# Patient Record
Sex: Female | Born: 1990 | Race: White | Hispanic: No | Marital: Single | Smoking: Never smoker
Health system: Southern US, Community
[De-identification: ages and names within clinical notes are randomized; demographics above are authoritative.]

---

## 2015-03-01 ENCOUNTER — Encounter (HOSPITAL_COMMUNITY): Payer: Self-pay | Admitting: *Deleted

## 2015-03-01 ENCOUNTER — Other Ambulatory Visit (HOSPITAL_COMMUNITY)
Admission: RE | Admit: 2015-03-01 | Discharge: 2015-03-01 | Disposition: A | Discharge status: Home / Self Care | Payer: Medicaid Other | Source: Ambulatory Visit | Attending: Emergency Medicine | Admitting: Emergency Medicine

## 2015-03-01 ENCOUNTER — Emergency Department (INDEPENDENT_AMBULATORY_CARE_PROVIDER_SITE_OTHER)
Admission: EM | Admit: 2015-03-01 | Discharge: 2015-03-01 | Disposition: A | Discharge status: Home / Self Care | Payer: PRIVATE HEALTH INSURANCE | Source: Home / Self Care | Attending: Emergency Medicine | Admitting: Emergency Medicine

## 2015-03-01 DIAGNOSIS — A499 Bacterial infection, unspecified: Secondary | ICD-10-CM

## 2015-03-01 DIAGNOSIS — Z113 Encounter for screening for infections with a predominantly sexual mode of transmission: Secondary | ICD-10-CM | POA: Insufficient documentation

## 2015-03-01 DIAGNOSIS — N760 Acute vaginitis: Secondary | ICD-10-CM | POA: Diagnosis present

## 2015-03-01 DIAGNOSIS — B9689 Other specified bacterial agents as the cause of diseases classified elsewhere: Secondary | ICD-10-CM

## 2015-03-01 LAB — POCT PREGNANCY, URINE: Preg Test, Ur: NEGATIVE

## 2015-03-01 LAB — POCT URINALYSIS DIP (DEVICE)
Bilirubin Urine: NEGATIVE
GLUCOSE, UA: NEGATIVE mg/dL
Hgb urine dipstick: NEGATIVE
Ketones, ur: NEGATIVE mg/dL
Leukocytes, UA: NEGATIVE
Nitrite: NEGATIVE
PH: 6 (ref 5.0–8.0)
PROTEIN: 30 mg/dL — AB
UROBILINOGEN UA: 0.2 mg/dL (ref 0.0–1.0)

## 2015-03-01 MED ORDER — LIDOCAINE HCL (PF) 1 % IJ SOLN
INTRAMUSCULAR | Status: AC
  Filled 2015-03-01: qty 5

## 2015-03-01 MED ORDER — AZITHROMYCIN 250 MG PO TABS
1000.0000 mg | ORAL_TABLET | Freq: Once | ORAL | Status: AC
  Administered 2015-03-01: 1000 mg via ORAL

## 2015-03-01 MED ORDER — CEFTRIAXONE SODIUM 250 MG IJ SOLR
250.0000 mg | Freq: Once | INTRAMUSCULAR | Status: AC
  Administered 2015-03-01: 250 mg via INTRAMUSCULAR

## 2015-03-01 MED ORDER — AZITHROMYCIN 250 MG PO TABS
ORAL_TABLET | ORAL | Status: AC
  Filled 2015-03-01: qty 4

## 2015-03-01 MED ORDER — METRONIDAZOLE 500 MG PO TABS: 500.0000 mg | ORAL_TABLET | Freq: Two times a day (BID) | ORAL | Status: AC

## 2015-03-01 MED ORDER — CEFTRIAXONE SODIUM 250 MG IJ SOLR
INTRAMUSCULAR | Status: AC
  Filled 2015-03-01: qty 250

## 2015-03-01 NOTE — ED Provider Notes (Addendum)
HPI  SUBJECTIVE:  Angel Pierce is a 24 y.o. female who presents with  3 days oderous thick, white vaginal discharge, Had vaginal  itching at first, but this resolved after using Monistat 3 day. Reports persistent vaginal discharge and odor. No urgency, frequency, dysuria, oderous urine, hematuria,  genital blisters. No other aggravating, alleviating factors.  No fevers, N/V, abd pain, back pain. No recent abx use. Pt sexually active with new  female  partner who is asxatic. States they generally use condoms, but didn't once. STD's  a concern today. Has h/o BV, yeast. No h/o gonorrhea chlamydia, Trichomonas, No h/o syphilis, herpes, HIV. No h/o DM. Patient is on Depo-Provera for contraception.    History reviewed. No pertinent past medical history.  History reviewed. No pertinent past surgical history.  History reviewed. No pertinent family history.  Social History  Substance Use Topics  . Smoking status: Never Smoker   . Smokeless tobacco: None  . Alcohol Use: Yes    No current facility-administered medications for this encounter.  Current outpatient prescriptions:  Marland Kitchen  MedroxyPROGESTERone Acetate (DEPO-PROVERA IM), Inject into the muscle., Disp: , Rfl:  .  metroNIDAZOLE (FLAGYL) 500 MG tablet, Take 1 tablet (500 mg total) by mouth 2 (two) times daily. X 7 days, Disp: 14 tablet, Rfl: 0  Allergies no known allergies   ROS  As noted in HPI.   Physical Exam  BP 128/82 mmHg  Pulse 78  Temp(Src) 98.6 F (37 C) (Oral)  Resp 16  SpO2 100%  Constitutional: Well developed, well nourished, no acute distress Eyes:  EOMI, conjunctiva normal bilaterally HENT: Normocephalic, atraumatic,mucus membranes moist Respiratory: Normal inspiratory effort Cardiovascular: Normal rate GI: nondistended soft, nontender. No suprapubic tenderness  back: No CVA tenderness GU: External genitalia normal.  Normal vaginal mucosa.  Normal os. Thin  oderous white vaginal discharge.  Uterus smooth,  NT. No   CMT. No  adnexal tenderness. No adnexal masses.  Chaperone present during exam skin: No rash, skin intact Musculoskeletal: no deformities Neurologic: Alert & oriented x 3, no focal neuro deficits Psychiatric: Speech and behavior appropriate   ED Course   Medications  azithromycin (ZITHROMAX) tablet 1,000 mg (1,000 mg Oral Given 03/01/15 1452)  cefTRIAXone (ROCEPHIN) injection 250 mg (250 mg Intramuscular Given 03/01/15 1452)    Orders Placed This Encounter  Procedures  . POCT urinalysis dip (device)    Standing Status: Standing     Number of Occurrences: 1     Standing Expiration Date:   . Pregnancy, urine POC    Standing Status: Standing     Number of Occurrences: 1     Standing Expiration Date:     No results found for this or any previous visit (from the past 24 hour(s)). No results found.  ED Clinical Impression  Vaginitis  BV (bacterial vaginosis)   ED Assessment/Plan U pregnant negative, no UTI. proteinuria is most likely from vaginitis.  H&P most c/w  BV.  Sent off GC/chlamydia, wet prep. Will treat empirically gonorrhea and chlamydia now per patient request. Giving ceftriaxone 250 mg IM, azithro 1 gm po, . Will send home with flagyl. Advised pt to refrain from sexual contact until she knows lab results, symptoms resolve, and partner(s) are treated if necessary. Pt provided working phone number. Pt agrees with plan.   Discussed MDM, plan and followup with patient. Discussed sn/sx that should prompt return to the UC or ED. Patient  agrees with plan.  *This clinic note was created using Dragon dictation software.  Therefore, there may be occasional mistakes despite careful proofreading.  ? Results for orders placed or performed during the hospital encounter of 03/01/15  POCT urinalysis dip (device)  Result Value Ref Range   Glucose, UA NEGATIVE NEGATIVE mg/dL   Bilirubin Urine NEGATIVE NEGATIVE   Ketones, ur NEGATIVE NEGATIVE mg/dL   Specific Gravity, Urine  >=1.030 1.005 - 1.030   Hgb urine dipstick NEGATIVE NEGATIVE   pH 6.0 5.0 - 8.0   Protein, ur 30 (A) NEGATIVE mg/dL   Urobilinogen, UA 0.2 0.0 - 1.0 mg/dL   Nitrite NEGATIVE NEGATIVE   Leukocytes, UA NEGATIVE NEGATIVE  Pregnancy, urine POC  Result Value Ref Range   Preg Test, Ur NEGATIVE NEGATIVE  Cervicovaginal ancillary only  Result Value Ref Range   Chlamydia **POSITIVE** (A)    Neisseria gonorrhea Negative   Cervicovaginal ancillary only  Result Value Ref Range   Wet Prep (BD Affirm) **POSITIVE for Gardnerella** (A)    8/31- gonorrhea/ chlamydia labs reviewed. Positive chlamydia. Negative gonorrhea. Wet prep still pending. Patient was treated for both gonorrhea and Chlamydia empirically in the clinic. RN to call patient, inform her of results and advise that partner be treated.  9/1- wet prep reviewed. Positive for BV. Patient was appropriately treated with Flagyl.  Domenick Gong, MD 03/01/15 1458  Domenick Gong, MD 03/02/15 1446  Domenick Gong, MD 03/03/15 [phone]

## 2015-03-01 NOTE — Discharge Instructions (Signed)
Take the medication as written. Give us a working phone number so that we can contact you if needed. Refrain from sexual contact until you know your results and your partner(s) are treated if necessary. Return if you get worse, have a fever >100.4, or for any concerns.  ° °Go to www.goodrx.com to look up your medications. This will give you a list of where you can find your prescriptions at the most affordable prices.   °

## 2015-03-01 NOTE — ED Notes (Signed)
Pt  Reports  Symptoms  Of  Vaginal  Irritation  /  Discharge  For  Several  Days    Pt  denys   Any  Low  Abdominal  Pain          Ambulated  With a  Slow  Steady  Fluid  Gait          Symptoms  Not  releived  By OTC  meds

## 2015-03-02 LAB — CERVICOVAGINAL ANCILLARY ONLY
CHLAMYDIA, DNA PROBE: POSITIVE — AB
NEISSERIA GONORRHEA: NEGATIVE

## 2015-03-03 LAB — CERVICOVAGINAL ANCILLARY ONLY: Wet Prep (BD Affirm): POSITIVE — AB

## 2015-03-04 NOTE — ED Notes (Signed)
Attempted  To  Call  Pt     Regarding  Lab  Results      -  Answering  Machine  Picked  Up       Message  Left  To  Return  Phone  Call

## 2015-03-04 NOTE — ED Notes (Signed)
Pt  Called   Back  Pt  Advised   Of  Pos   Tests   Results  After   Id  Verified     Pt  Reports  Symptoms     Better       Pt  Advised  To  Return if  Symptoms   Reoccur    Advised   To   Notify  Partner  Complete  The  Plan of  Care   Use  Protection

## 2015-03-15 NOTE — ED Notes (Signed)
[facility] form [phone] completed and faxed to [facility] for their records

## 2015-09-29 ENCOUNTER — Encounter: Payer: PRIVATE HEALTH INSURANCE | Admitting: Obstetrics and Gynecology

## 2016-08-11 ENCOUNTER — Encounter (HOSPITAL_COMMUNITY): Payer: Self-pay | Admitting: Emergency Medicine

## 2016-08-11 ENCOUNTER — Emergency Department (HOSPITAL_COMMUNITY): Payer: PRIVATE HEALTH INSURANCE

## 2016-08-11 ENCOUNTER — Emergency Department (HOSPITAL_COMMUNITY)
Admission: EM | Admit: 2016-08-11 | Discharge: 2016-08-11 | Disposition: A | Discharge status: Home / Self Care | Payer: PRIVATE HEALTH INSURANCE | Attending: Emergency Medicine | Admitting: Emergency Medicine

## 2016-08-11 DIAGNOSIS — R0981 Nasal congestion: Secondary | ICD-10-CM | POA: Insufficient documentation

## 2016-08-11 DIAGNOSIS — R6889 Other general symptoms and signs: Secondary | ICD-10-CM

## 2016-08-11 DIAGNOSIS — R05 Cough: Secondary | ICD-10-CM | POA: Insufficient documentation

## 2016-08-11 MED ORDER — OSELTAMIVIR PHOSPHATE 75 MG PO CAPS: 75.0000 mg | ORAL_CAPSULE | Freq: Two times a day (BID) | ORAL | 0 refills | Status: AC

## 2016-08-11 NOTE — ED Provider Notes (Signed)
[facility]-EMERGENCY DEPT Provider Note   CSN: [id number] Arrival date & time: 08/11/16  1820  .By signing my name below, I, Linna DarnerRussell Turner, attest that this documentation has been prepared under the direction and in the presence of Arthor CaptainAbigail Ailyn Gladd, PA-C. Electronically Signed: Linna Darnerussell Turner, Scribe. 08/11/2016. 7:25 PM.  History   Chief Complaint Chief Complaint  Patient presents with  . flu symptoms    The history is provided by the patient. No language interpreter was used.     HPI Comments: Angel Pierce is a 26 y.o. female who presents to the Emergency Department complaining of flu-like symptoms beginning two days ago. She reports nasal congestion, cough, generalized body aches, fever (tmax 102), and chills. She also notes some chest pain secondary to her cough. Pt has been taking Dayquil and hydrating well with some improvement of her symptoms. No h/o asthma. She did not receive a flu vaccination this season. She denies nausea, vomiting, or any other associated symptoms.  History reviewed. No pertinent past medical history.  There are no active problems to display for this patient.   History reviewed. No pertinent surgical history.  OB History    No data available       Home Medications    Prior to Admission medications   Medication Sig Start Date End Date Taking? Authorizing Provider  MedroxyPROGESTERone Acetate (DEPO-PROVERA IM) Inject into the muscle.    Historical Provider, MD  metroNIDAZOLE (FLAGYL) 500 MG tablet Take 1 tablet (500 mg total) by mouth 2 (two) times daily. X 7 days 03/01/15   Domenick GongAshley Mortenson, MD    Family History No family history on file.  Social History Social History  Substance Use Topics  . Smoking status: Never Smoker  . Smokeless tobacco: Not on file  . Alcohol use Yes     Allergies   Patient has no known allergies.   Review of Systems Review of Systems  Constitutional: Positive for chills and fever.  HENT: Positive for congestion.    Respiratory: Positive for cough.   Cardiovascular: Positive for chest pain (secondary to cough).  Gastrointestinal: Negative for nausea and vomiting.  Musculoskeletal: Positive for myalgias.  All other systems reviewed and are negative.    Physical Exam Updated Vital Signs BP 125/75 (BP Location: Left Arm)   Pulse 80   Temp 98.7 F (37.1 C) (Oral)   Resp 16   Ht 5\' 6"  (1.676 m)   Wt 140 lb (63.5 kg)   SpO2 100%   BMI 22.60 kg/m   Physical Exam  Constitutional: She is oriented to person, place, and time. She appears well-developed and well-nourished. No distress.  HENT:  Head: Normocephalic and atraumatic.  Facial congestion.  Eyes: Conjunctivae and EOM are normal.  Neck: Neck supple. No tracheal deviation present.  Cardiovascular: Normal rate.   Pulmonary/Chest: Effort normal. No respiratory distress.  Musculoskeletal: Normal range of motion.  Neurological: She is alert and oriented to person, place, and time.  Skin: Skin is warm and dry.  Psychiatric: She has a normal mood and affect. Her behavior is normal.  Nursing note and vitals reviewed.    ED Treatments / Results  Labs (all labs ordered are listed, but only abnormal results are displayed) Labs Reviewed - No data to display  EKG  EKG Interpretation None       Radiology No results found.  Procedures Procedures (including critical care time)  DIAGNOSTIC STUDIES: Oxygen Saturation is 100% on RA, normal by my interpretation.    COORDINATION OF CARE:  7:28 PM Discussed treatment plan with pt at bedside and pt agreed to plan.  Medications Ordered in ED Medications - No data to display   Initial Impression / Assessment and Plan / ED Course  I have reviewed the triage vital signs and the nursing notes.  Pertinent labs & imaging results that were available during my care of the patient were reviewed by me and considered in my medical decision making (see chart for details).     Patient with  symptoms consistent with influenza. .  No signs of dehydration, tolerating PO's.  Lungs are clear. CXR neg. Patient will be given tamiflu per cdc reccommendations.  Patient will be discharged with instructions to orally hydrate, rest, and use over-the-counter medications such as anti-inflammatories ibuprofen and Aleve for muscle aches and Tylenol for fever.  Patient will also be given a cough suppressant.   Final Clinical Impressions(s) / ED Diagnoses   Final diagnoses:  Flu-like symptoms    New Prescriptions New Prescriptions   No medications on file   I personally performed the services described in this documentation, which was scribed in my presence. The recorded information has been reviewed and is accurate.      Arthor Captain, PA-C 08/14/16 1619    Rolan Bucco, MD 08/20/16 [phone]

## 2016-08-11 NOTE — ED Triage Notes (Signed)
Pt c/o nasal congestion, body aches, fever/chills, productive cough with thin clear mucous, headache x 2 days. Some loss of appetite. Denies n/v/d. She has taken BC powders and Dayquil with little relief. She has not had a flu vaccination this year. She denies sick contacts.

## 2016-08-11 NOTE — Discharge Instructions (Signed)
Your xray is negative  You appear to have an upper respiratory infection (URI). An upper respiratory tract infection, or cold, is a viral infection of the air passages leading to the lungs. It is contagious and can be spread to others, especially during the first 3 or 4 days. It cannot be cured by antibiotics or other medicines. RETURN IMMEDIATELY IF you develop shortness of breath, confusion or altered mental status, a new rash, become dizzy, faint, or poorly responsive, or are unable to be cared for at home.

## 2017-05-12 ENCOUNTER — Emergency Department (HOSPITAL_COMMUNITY): Payer: Medicaid Other

## 2017-05-12 ENCOUNTER — Encounter (HOSPITAL_COMMUNITY): Payer: Self-pay | Admitting: Emergency Medicine

## 2017-05-12 ENCOUNTER — Emergency Department (HOSPITAL_COMMUNITY)
Admission: EM | Admit: 2017-05-12 | Discharge: 2017-05-12 | Disposition: A | Discharge status: Home / Self Care | Payer: Medicaid Other | Attending: Emergency Medicine | Admitting: Emergency Medicine

## 2017-05-12 DIAGNOSIS — Z3A12 12 weeks gestation of pregnancy: Secondary | ICD-10-CM | POA: Insufficient documentation

## 2017-05-12 DIAGNOSIS — O468X1 Other antepartum hemorrhage, first trimester: Secondary | ICD-10-CM | POA: Diagnosis not present

## 2017-05-12 DIAGNOSIS — R102 Pelvic and perineal pain: Secondary | ICD-10-CM | POA: Insufficient documentation

## 2017-05-12 DIAGNOSIS — Z79899 Other long term (current) drug therapy: Secondary | ICD-10-CM | POA: Insufficient documentation

## 2017-05-12 DIAGNOSIS — O9989 Other specified diseases and conditions complicating pregnancy, childbirth and the puerperium: Secondary | ICD-10-CM | POA: Insufficient documentation

## 2017-05-12 DIAGNOSIS — O200 Threatened abortion: Secondary | ICD-10-CM | POA: Diagnosis not present

## 2017-05-12 DIAGNOSIS — O418X10 Other specified disorders of amniotic fluid and membranes, first trimester, not applicable or unspecified: Secondary | ICD-10-CM

## 2017-05-12 DIAGNOSIS — N898 Other specified noninflammatory disorders of vagina: Secondary | ICD-10-CM | POA: Diagnosis not present

## 2017-05-12 DIAGNOSIS — N390 Urinary tract infection, site not specified: Secondary | ICD-10-CM | POA: Diagnosis not present

## 2017-05-12 DIAGNOSIS — O26891 Other specified pregnancy related conditions, first trimester: Secondary | ICD-10-CM | POA: Insufficient documentation

## 2017-05-12 DIAGNOSIS — R1030 Lower abdominal pain, unspecified: Secondary | ICD-10-CM

## 2017-05-12 LAB — URINALYSIS, ROUTINE W REFLEX MICROSCOPIC
BILIRUBIN URINE: NEGATIVE
Glucose, UA: NEGATIVE mg/dL
Hgb urine dipstick: NEGATIVE
KETONES UR: 20 mg/dL — AB
Nitrite: POSITIVE — AB
PROTEIN: NEGATIVE mg/dL
Specific Gravity, Urine: 1.016 (ref 1.005–1.030)
pH: 7 (ref 5.0–8.0)

## 2017-05-12 LAB — COMPREHENSIVE METABOLIC PANEL
ALBUMIN: 3.8 g/dL (ref 3.5–5.0)
ALK PHOS: 68 U/L (ref 38–126)
ALT: 15 U/L (ref 14–54)
ANION GAP: 10 (ref 5–15)
AST: 19 U/L (ref 15–41)
BILIRUBIN TOTAL: 0.6 mg/dL (ref 0.3–1.2)
BUN: 8 mg/dL (ref 6–20)
CALCIUM: 9.1 mg/dL (ref 8.9–10.3)
CO2: 20 mmol/L — ABNORMAL LOW (ref 22–32)
Chloride: 106 mmol/L (ref 101–111)
Creatinine, Ser: 0.67 mg/dL (ref 0.44–1.00)
GFR calc Af Amer: >60 mL/min (ref >60)
Glucose, Bld: 86 mg/dL (ref 65–99)
POTASSIUM: 3.7 mmol/L (ref 3.5–5.1)
Sodium: 136 mmol/L (ref 135–145)
TOTAL PROTEIN: 6.9 g/dL (ref 6.5–8.1)

## 2017-05-12 LAB — CBC
HCT: 39.1 % (ref 36.0–46.0)
HEMOGLOBIN: 13.9 g/dL (ref 12.0–15.0)
MCH: 31 pg (ref 26.0–34.0)
MCHC: 35.5 g/dL (ref 30.0–36.0)
MCV: 87.1 fL (ref 78.0–100.0)
PLATELETS: 293 10*3/uL (ref 150–400)
RBC: 4.49 MIL/uL (ref 3.87–5.11)
RDW: 12.1 % (ref 11.5–15.5)
WBC: 12.2 10*3/uL — ABNORMAL HIGH (ref 4.0–10.5)

## 2017-05-12 LAB — WET PREP, GENITAL
CLUE CELLS WET PREP: NONE SEEN
Sperm: NONE SEEN
Trich, Wet Prep: NONE SEEN
YEAST WET PREP: NONE SEEN

## 2017-05-12 LAB — ABO/RH: ABO/RH(D): O POS

## 2017-05-12 LAB — HCG, QUANTITATIVE, PREGNANCY: hCG, Beta Chain, Quant, S: 136054 m[IU]/mL — ABNORMAL HIGH (ref <5)

## 2017-05-12 MED ORDER — SODIUM CHLORIDE 0.9 % IV BOLUS (SEPSIS)
1000.0000 mL | Freq: Once | INTRAVENOUS | Status: AC
  Administered 2017-05-12: 1000 mL via INTRAVENOUS

## 2017-05-12 MED ORDER — PRENATAL COMPLETE 14-0.4 MG PO TABS: 1.0000 | ORAL_TABLET | Freq: Every day | ORAL | 2 refills | Status: AC

## 2017-05-12 MED ORDER — CEPHALEXIN 500 MG PO CAPS: 1000.0000 mg | ORAL_CAPSULE | Freq: Two times a day (BID) | ORAL | 0 refills | Status: AC

## 2017-05-12 NOTE — ED Provider Notes (Signed)
[facility]-EMERGENCY DEPT Provider Note   CSN: [id number] Arrival date & time: 05/12/17  1643     History   Chief Complaint Chief Complaint  Patient presents with  . Vaginal Discharge    HPI Angel Pierce is a 26 y.o. G2P1001 otherwise healthy female, currently ~6232w2d pregnant based on LMP, who presents to the ED with complaints of intermittent lower abdominal cramping, vaginal discharge, vaginal spotting, nausea, and some mild vaginal itching/irritation x1.5 weeks.  Patient states that she just recently found out that she is pregnant, LMP was 02/15/17, and she is sexually active with one female partner, unprotected.  She does not currently have any prenatal care, but previously she was under the care [place] [facility] [place] for her prior pregnancy (5369yrs ago); no prior issues with that pregnancy, no hx of ectopic or miscarriages.  She states that she has been quite a bit stressed out lately, and wonders whether this is causing her symptoms.  She describes her pain as 3/10 intermittent lower abdominal cramping, nonradiating, with no known aggravating or alleviating factors, has not tried anything for her symptoms.  She states that her vaginal discharge is a clearish creamy white color.  She also reports occasional brownish vaginal bleeding, stating that it is mostly just a scant spotting when she uses the restroom, no passage of clots. Reports typical morning nausea. Also mentions that she has some occasional mild vaginal irritation/itching.   She denies fevers, chills, CP, SOB, vomiting, diarrhea/constipation, obstipation, melena, hematochezia, hematuria, dysuria, genital sores, myalgias, arthralgias, numbness, tingling, focal weakness, or any other complaints at this time. Denies recent travel, sick contacts, suspicious food intake, EtOH use, NSAID use, or prior abd surgeries.    The history is provided by the patient and medical records. No language interpreter was  used.  Vaginal Discharge   Associated symptoms include abdominal pain and nausea. Pertinent negatives include no fever, no constipation, no diarrhea, no vomiting and no dysuria.  Abdominal Cramping  This is a new problem. The current episode started more than 1 week ago. The problem occurs daily. The problem has not changed since onset.Associated symptoms include abdominal pain. Pertinent negatives include no chest pain and no shortness of breath. Nothing aggravates the symptoms. Nothing relieves the symptoms. She has tried nothing for the symptoms. The treatment provided no relief.    History reviewed. No pertinent past medical history.  There are no active problems to display for this patient.   History reviewed. No pertinent surgical history.  OB History    No data available       Home Medications    Prior to Admission medications   Medication Sig Start Date End Date Taking? Authorizing Provider  MedroxyPROGESTERone Acetate (DEPO-PROVERA IM) Inject into the muscle.    [provider]  metroNIDAZOLE (FLAGYL) 500 MG tablet Take 1 tablet (500 mg total) by mouth 2 (two) times daily. X 7 days 03/01/15   Domenick GongMortenson, Ashley, MD  oseltamivir (TAMIFLU) 75 MG capsule Take 1 capsule (75 mg total) by mouth every 12 (twelve) hours. 08/11/16   Arthor CaptainHarris, Abigail, PA-C    Family History History reviewed. No pertinent family history.  Social History Social History   Tobacco Use  . Smoking status: Never Smoker  Substance Use Topics  . Alcohol use: Yes  . Drug use: Not on file     Allergies   Patient has no known allergies.   Review of Systems Review of Systems  Constitutional: Negative for chills and  fever.  Respiratory: Negative for shortness of breath.   Cardiovascular: Negative for chest pain.  Gastrointestinal: Positive for abdominal pain and nausea. Negative for blood in stool, constipation, diarrhea and vomiting.  Genitourinary: Positive for vaginal bleeding, vaginal  discharge and vaginal pain (slight itching/irritation). Negative for dysuria, genital sores and hematuria.  Musculoskeletal: Negative for arthralgias and myalgias.  Skin: Negative for color change.  Allergic/Immunologic: Negative for immunocompromised state.  Neurological: Negative for weakness and numbness.  Psychiatric/Behavioral: Negative for confusion.   All other systems reviewed and are negative for acute change except as noted in the HPI.    Physical Exam Updated Vital Signs BP 125/76 (BP Location: Left Arm)   Pulse 76   Temp 98.2 F (36.8 C) (Oral)   Resp 18   SpO2 100%   Physical Exam  Constitutional: She is oriented to person, place, and time. Vital signs are normal. She appears well-developed and well-nourished.  Non-toxic appearance. No distress.  Afebrile, nontoxic, NAD although appears slightly anxious  HENT:  Head: Normocephalic and atraumatic.  Mouth/Throat: Oropharynx is clear and moist and mucous membranes are normal.  Eyes: Conjunctivae and EOM are normal. Right eye exhibits no discharge. Left eye exhibits no discharge.  Neck: Normal range of motion. Neck supple.  Cardiovascular: Normal rate, regular rhythm, normal heart sounds and intact distal pulses. Exam reveals no gallop and no friction rub.  No murmur heard. Pulmonary/Chest: Effort normal and breath sounds normal. No respiratory distress. She has no decreased breath sounds. She has no wheezes. She has no rhonchi. She has no rales.  Abdominal: Soft. Normal appearance and bowel sounds are normal. She exhibits no distension. There is no tenderness. There is no rigidity, no rebound, no guarding, no CVA tenderness, no tenderness at McBurney's point and negative Murphy's sign.  Soft, nondistended, +BS throughout, with no significant abdominal tenderness, no r/g/r, neg murphy's, neg mcburney's, no CVA TTP   Genitourinary: Pelvic exam was performed with patient supine. There is no rash, tenderness or lesion on the  right labia. There is no rash, tenderness or lesion on the left labia. Uterus is enlarged. Uterus is not deviated, not fixed and not tender. Cervix exhibits no motion tenderness, no discharge and no friability. Right adnexum displays no mass, no tenderness and no fullness. Left adnexum displays no mass, no tenderness and no fullness. No erythema, tenderness or bleeding in the vagina. Vaginal discharge found.  Genitourinary Comments: Chaperone present for exam. No rashes, lesions, or tenderness to external genitalia. No erythema, injury, or tenderness to vaginal mucosa. Scant creamy white vaginal discharge adhered to the walls of the vaginal vault, no vaginal blood within vaginal vault. No  adnexal masses, tenderness, or fullness. No CMT, cervical friability, or discharge from cervical os. Cervical os is closed. Uterus non-deviated, mobile, nonTTP, and gravid up just above the pubic symphysis.  Musculoskeletal: Normal range of motion.  Neurological: She is alert and oriented to person, place, and time. She has normal strength. No sensory deficit.  Skin: Skin is warm, dry and intact. No rash noted.  Psychiatric: She has a normal mood and affect.  Nursing note and vitals reviewed.    ED Treatments / Results  Labs (all labs ordered are listed, but only abnormal results are displayed) Labs Reviewed  WET PREP, GENITAL - Abnormal; Notable for the following components:      Result Value   WBC, Wet Prep HPF POC MODERATE (*)    All other components within normal limits  HCG, QUANTITATIVE, PREGNANCY -  Abnormal; Notable for the following components:   hCG, Beta Chain, Quant, S 136,054 (*)    All other components within normal limits  URINALYSIS, ROUTINE W REFLEX MICROSCOPIC - Abnormal; Notable for the following components:   Ketones, ur 20 (*)    Nitrite POSITIVE (*)    Leukocytes, UA TRACE (*)    Bacteria, UA MANY (*)    Squamous Epithelial / LPF 0-5 (*)    All other components within normal limits    CBC - Abnormal; Notable for the following components:   WBC 12.2 (*)    All other components within normal limits  COMPREHENSIVE METABOLIC PANEL - Abnormal; Notable for the following components:   CO2 20 (*)    All other components within normal limits  URINE CULTURE  RPR  HIV ANTIBODY (ROUTINE TESTING)  ABO/RH  GC/CHLAMYDIA PROBE AMP ([facility]) NOT AT [facility]    EKG  EKG Interpretation None       Radiology [place] Ob Comp Less 14 Wks  Result Date: 05/12/2017 CLINICAL DATA:  26 year old pregnant female with suprapubic pain and brown discharge. LMP: 02/15/2017 corresponding to an estimated gestational age of [redacted] weeks, 2 days. EXAM: OBSTETRIC <14 WK ULTRASOUND TECHNIQUE: Transabdominal ultrasound was performed for evaluation of the gestation as well as the maternal uterus and adnexal regions. COMPARISON:  None. FINDINGS: Intrauterine gestational sac: Single intrauterine gestational sac. Yolk sac:  Seen Embryo:  Present Cardiac Activity: Detected Heart Rate: 169 bpm CRL:   25  mm   9 w 2 d                  US EDC: 12/13/2017 Subchorionic hemorrhage: There is a 1.9 x 2.5 x 2.0 cm subchorionic hemorrhage inferior to the gestational sac abutting approximately 20% of the circumference of the gestational sac. Maternal uterus/adnexae: The maternal ovaries appear unremarkable. A corpus luteum is noted in the left ovary. IMPRESSION: 1. Single live intrauterine pregnancy with an estimated gestational age of [redacted] weeks, 2 days based on today's ultrasound. 2. A small subchorionic hemorrhage inferior to the gestational sac. Follow-up recommended. 3. Unremarkable ovaries. Electronically Signed   By: Elgie CollardArash  Radparvar M.D.   On: 05/12/2017 19:27    Procedures Procedures (including critical care time)  Medications Ordered in ED Medications  sodium chloride 0.9 % bolus 1,000 mL (1,000 mLs Intravenous New Bag/Given 05/12/17 2029)     Initial Impression / Assessment and Plan / ED Course  I have reviewed the  triage vital signs and the nursing notes.  Pertinent labs & imaging results that were available during my care of the patient were reviewed by me and considered in my medical decision making (see chart for details).     26 y.o. female here with lower abd cramping, vaginal discharge, spotting, some vaginal itching, and nausea; recently found out she is pregnant, LMP 02/15/17 so EGA 2075w2d. G2P1001, no issues in prior pregnancy. On exam, no significant abdominal tenderness, VSS, NAD although appears slightly anxious; pelvic exam reveals scant creamy white vaginal discharge adhered to the walls of the vaginal vault, no bleeding noted, no CMT or adnexal tenderness. Will get labs, STD testing, U/A, quantHCG, ABO/Rh, and pelvic U/S. Pt declines wanting anything for pain/nausea. Will reassess shortly.   9:05 PM CBC with marginally elevated WBC 12.2, likely just stress demargination. CMP essentially WNL. U/A with +nitrite +leuks 0-5 squamous 6-30 WBCs and many bacteria, consistent with UTI; UCx sent, will treat for UTI using keflex. Wet prep with moderate WBCs but  otherwise negative, doubt need for empiric GC/CT treatment, likely just physiologic discharge of pregnancy. QuantHCG 161,096, appropriate for EGA. ABO/Rh O+. Pelvic U/S reveals single live IUP with EGA [redacted]w[redacted]d with small subchorionic hemorrhage inferior to gestational sac.  Advised starting prenatals, discussed pelvic rest, tylenol for pain but no NSAIDs, stay hydrated, rx abx given, and discussed f/up with OBGYN/women's clinic in 2-3 days to establish prenatal care and for recheck after today's visit. Strict return precautions discussed, advised going to women's hospital for emergent changes/worsening in symptoms. Discussed abstinence until STD results come back, and f/up with health dept for future STD concerns; Safe sex encouraged, and discussed having partners tested and treated before re-engaging in intercourse after results come back. I explained the  diagnosis and have given explicit precautions to return to the ER including for any other new or worsening symptoms. The patient understands and accepts the medical plan as it's been dictated and I have answered their questions. Discharge instructions concerning home care and prescriptions have been given. The patient is STABLE and is discharged to home in good condition.    Final Clinical Impressions(s) / ED Diagnoses   Final diagnoses:  Pelvic pain in pregnancy, antepartum, first trimester  Subchorionic hemorrhage of placenta in first trimester, single or unspecified fetus  Vaginal discharge  Pregnancy related bilateral lower abdominal cramping, antepartum  Round ligament pain  Threatened miscarriage in early pregnancy  Lower urinary tract infectious disease    ED Discharge Orders        Ordered    Prenatal Vit-Fe Fumarate-FA (PRENATAL COMPLETE) 14-0.4 MG TABS  Daily after breakfast     05/12/17 2105    cephALEXin (KEFLEX) 500 MG capsule  2 times daily     05/12/17 [address] [place], [place] 05/12/17 2111    Little, Ambrose Finland, MD 05/12/17 [phone]

## 2017-05-12 NOTE — ED Notes (Signed)
Pt aware of need for urine specimen. 

## 2017-05-12 NOTE — Discharge Instructions (Addendum)
Bleeding and cramping during the first 20 weeks of pregnancy is common. This is sometimes called a threatened miscarriage. This is a pregnancy that is threatening to end before the twentieth week of pregnancy.  Miscarriages occur in 15 to 20% of all pregnancies and usually occur during the first 13 weeks of the pregnancy. The exact cause of a miscarriage is usually never known. A miscarriage is nature?s way of ending a pregnancy that is abnormal or would not make it to term.   Allow your pelvis to rest, as outlined below. You have a urinary tract infection, take antibiotics as directed until completed.  Start taking prenatal vitamins. You can use tylenol safely in pregnancy, use as needed for pain, but avoid NSAIDs like ibuprofen or aleve/motrin/etc. Follow the instructions below regarding your diagnosis today. Establish prenatal care with your prior OBGYN, you can call the women's outpatient clinic to do this, and you will need to be checked in 2 days for re-evaluation as described below. [facility] (similar to their version of an emergency room) for changes/worsening symptoms as outlined below.  Also, you have been tested for gonorrhea, chlamydia, HIV, and Syphilis, and the hospital lab will call you if the tests are positive. Do not have sexual intercourse until you've found out about your test results. Make sure all sexual partners are tested and treated before re-engaging in sexual intercourse. Follow up with the health department for future STD concerns/testing/treatment/etc.     HOME CARE INSTRUCTIONS DO NOT USE TAMPONS. Do not douche, have sexual intercourse or orgasms until approved by your caregiver.  [facility] today or tomorrow to schedule a re-evaluation of your pregnancy and repeat blood betaHCG test in 2 days.    SEEK IMMEDIATE MEDICAL ATTENTION AT THE [facility] IF: You have severe cramps in your stomach, back, or abdomen.  You have a  sudden onset of severe pain in the lower part of your abdomen.  You run an unexplained temperature of 101 F (38.3 C) or higher.  You pass large clots or tissue. Save any tissue for your caregiver to inspect.  Your bleeding increases or you become light-headed, weak, or have fainting episodes.

## 2017-05-12 NOTE — ED Triage Notes (Signed)
Pt reports she is about [redacted] weeks pregnant and has been having brown spotting, discharge and cramping for the past week.

## 2017-05-13 LAB — URINE CYTOLOGY ANCILLARY ONLY
CHLAMYDIA, DNA PROBE: NEGATIVE
NEISSERIA GONORRHEA: NEGATIVE

## 2017-05-13 LAB — GC/CHLAMYDIA PROBE AMP (~~LOC~~) NOT AT ARMC
CHLAMYDIA, DNA PROBE: NEGATIVE
NEISSERIA GONORRHEA: NEGATIVE

## 2017-05-13 LAB — HIV ANTIBODY (ROUTINE TESTING W REFLEX): HIV SCREEN 4TH GENERATION: NONREACTIVE

## 2017-05-13 LAB — RPR: RPR: NONREACTIVE

## 2017-11-12 ENCOUNTER — Emergency Department (HOSPITAL_COMMUNITY)
Admission: EM | Admit: 2017-11-12 | Discharge: 2017-11-12 | Disposition: A | Discharge status: Home / Self Care | Payer: BLUE CROSS/BLUE SHIELD | Attending: Emergency Medicine | Admitting: Emergency Medicine

## 2017-11-12 ENCOUNTER — Encounter (HOSPITAL_COMMUNITY): Payer: Self-pay

## 2017-11-12 DIAGNOSIS — H9201 Otalgia, right ear: Secondary | ICD-10-CM | POA: Diagnosis present

## 2017-11-12 DIAGNOSIS — Z79899 Other long term (current) drug therapy: Secondary | ICD-10-CM | POA: Insufficient documentation

## 2017-11-12 DIAGNOSIS — H6500 Acute serous otitis media, unspecified ear: Secondary | ICD-10-CM

## 2017-11-12 DIAGNOSIS — H6501 Acute serous otitis media, right ear: Secondary | ICD-10-CM | POA: Insufficient documentation

## 2017-11-12 MED ORDER — OXYCODONE-ACETAMINOPHEN 5-325 MG PO TABS
1.0000 | ORAL_TABLET | Freq: Once | ORAL | Status: AC
  Administered 2017-11-12: 1 via ORAL
  Filled 2017-11-12: qty 1

## 2017-11-12 MED ORDER — HYDROCODONE-ACETAMINOPHEN 5-325 MG PO TABS: 1.0000 | ORAL_TABLET | ORAL | 0 refills | Status: AC | PRN

## 2017-11-12 MED ORDER — AMOXICILLIN-POT CLAVULANATE ER 1000-62.5 MG PO TB12: 2.0000 | ORAL_TABLET | Freq: Two times a day (BID) | ORAL | 0 refills | Status: AC

## 2017-11-12 NOTE — ED Provider Notes (Signed)
[facility]-EMERGENCY DEPT Provider Note   CSN: [id number] Arrival date & time: 11/12/17  2156     History   Chief Complaint Chief Complaint  Patient presents with  . Otalgia    HPI Ronnika Collett is a 27 y.o. female.  27 year old female presents with 3-day history of right otitis media.  Seen in urgent care and placed on Cefdnir but continues to note discomfort characterized as sharp stabbing pain.  She is also taking eardrops as well 2.  Denies any fever or chills.  No sore throat.  No cough congestion.  No vomiting.     History reviewed. No pertinent past medical history.  There are no active problems to display for this patient.   History reviewed. No pertinent surgical history.   OB History   None      Home Medications    Prior to Admission medications   Medication Sig Start Date End Date Taking? Authorizing Provider  metroNIDAZOLE (FLAGYL) 500 MG tablet Take 1 tablet (500 mg total) by mouth 2 (two) times daily. X 7 days Patient not taking: Reported on 05/12/2017 03/01/15   Domenick Gong, MD  Multiple Vitamin (MULTIVITAMIN WITH MINERALS) TABS tablet Take 1 tablet daily by mouth.    [provider]  oseltamivir (TAMIFLU) 75 MG capsule Take 1 capsule (75 mg total) by mouth every 12 (twelve) hours. Patient not taking: Reported on 05/12/2017 08/11/16   Arthor Captain, PA-C  Prenatal Vit-Fe Fumarate-FA (PRENATAL COMPLETE) 14-0.4 MG TABS Take 1 tablet daily after breakfast by mouth. 05/12/17   Street, Francis, PA-C  Prenatal Vit-Fe Fumarate-FA (PRENATAL MULTIVITAMIN) TABS tablet Take 1 tablet daily at 12 noon by mouth.    [provider]    Family History History reviewed. No pertinent family history.  Social History Social History   Tobacco Use  . Smoking status: Never Smoker  . Smokeless tobacco: Never Used  Substance Use Topics  . Alcohol use: Yes  . Drug use: Never     Allergies   Patient has no known  allergies.   Review of Systems Review of Systems  All other systems reviewed and are negative.    Physical Exam Updated Vital Signs BP (!) 134/91 (BP Location: Left Arm)   Pulse (!) 107   Temp (!) 100.5 F (38.1 C) (Oral)   Resp 18   Ht 1.676 m ( )   Wt 64.4 kg (142 lb)   SpO2 100%   BMI 22.92 kg/m   Physical Exam  Constitutional: She is oriented to person, place, and time. She appears well-developed and well-nourished.  Non-toxic appearance. No distress.  HENT:  Head: Normocephalic and atraumatic.  Right Ear: There is drainage. No mastoid tenderness. Tympanic membrane is erythematous.  Eyes: Pupils are equal, round, and reactive to light. Conjunctivae, EOM and lids are normal.  Neck: Normal range of motion. Neck supple. No tracheal deviation present. No thyroid mass present.  Cardiovascular: Normal rate, regular rhythm and normal heart sounds. Exam reveals no gallop.  No murmur heard. Pulmonary/Chest: Effort normal and breath sounds normal. No stridor. No respiratory distress. She has no decreased breath sounds. She has no wheezes. She has no rhonchi. She has no rales.  Abdominal: Soft. Normal appearance and bowel sounds are normal. She exhibits no distension. There is no tenderness. There is no rebound and no CVA tenderness.  Musculoskeletal: Normal range of motion. She exhibits no edema or tenderness.  Neurological: She is alert and oriented to person, place, and time. She  has normal strength. No cranial nerve deficit or sensory deficit. GCS eye subscore is 4. GCS verbal subscore is 5. GCS motor subscore is 6.  Skin: Skin is warm and dry. No abrasion and no rash noted.  Psychiatric: She has a normal mood and affect. Her speech is normal and behavior is normal.  Nursing note and vitals reviewed.    ED Treatments / Results  Labs (all labs ordered are listed, but only abnormal results are displayed) Labs Reviewed - No data to display  EKG None  Radiology No  results found.  Procedures Procedures (including critical care time)  Medications Ordered in ED Medications  oxyCODONE-acetaminophen (PERCOCET/ROXICET) 5-325 MG per tablet 1 tablet (has no administration in time range)     Initial Impression / Assessment and Plan / ED Course  I have reviewed the triage vital signs and the nursing notes.  Pertinent labs & imaging results that were available during my care of the patient were reviewed by me and considered in my medical decision making (see chart for details).     Pt given percocet Will tx with high dose augmentin ent referral given  Final Clinical Impressions(s) / ED Diagnoses   Final diagnoses:  None    ED Discharge Orders    None       Lorre Nick, MD 11/12/17 2319

## 2017-11-12 NOTE — ED Triage Notes (Signed)
Pt complains of right ear pain for two days, denies recent URI

## 2017-11-12 NOTE — ED Notes (Signed)
Bed: WTR6 Expected date:  Expected time:  Means of arrival:  Comments: 

## 2018-01-26 IMAGING — CR DG CHEST 2V
2 series · 2 of 2 positions shown · non-contrast
Comparison: None.

CLINICAL DATA: Fever cough and chest pain

EXAM:
CHEST  2 VIEW

[w chest pa]
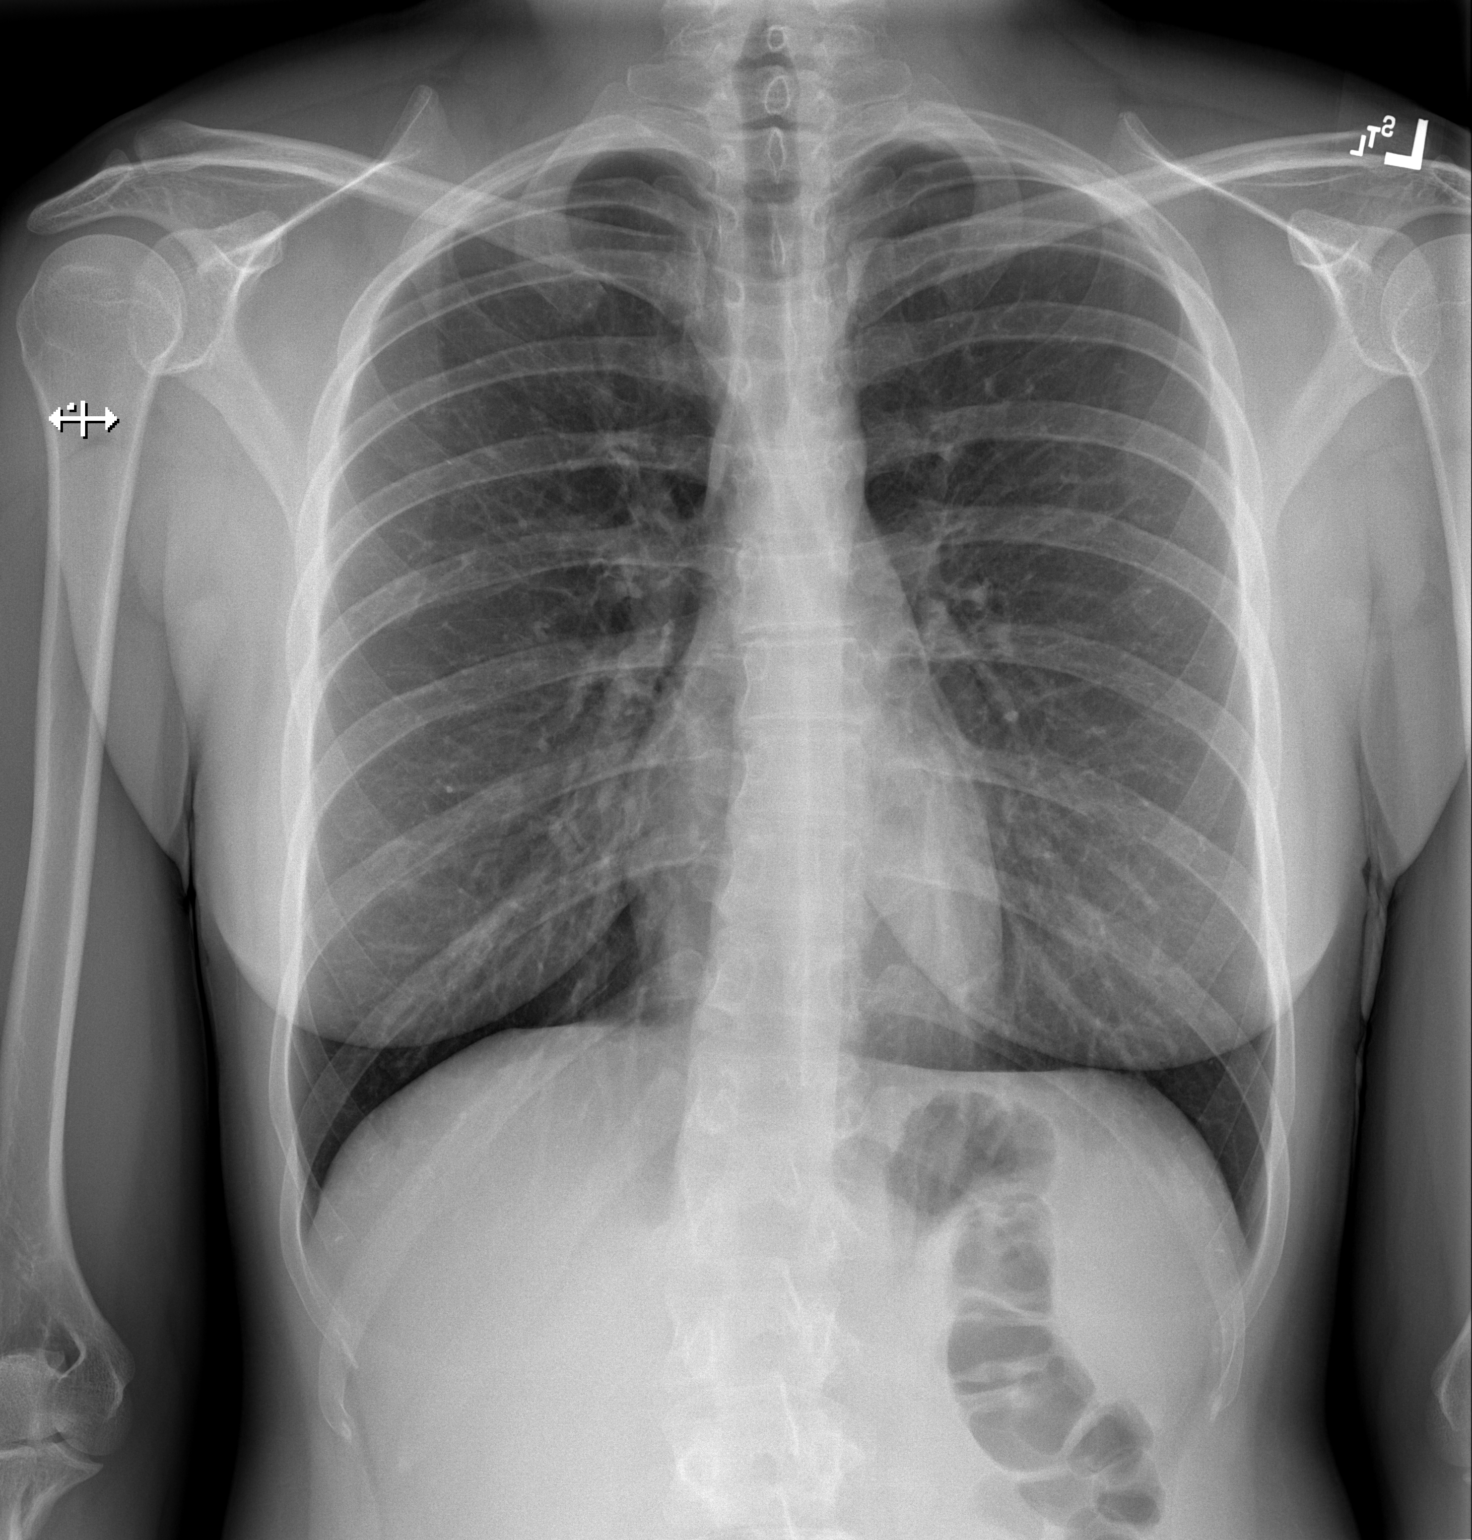

[w chest lat]
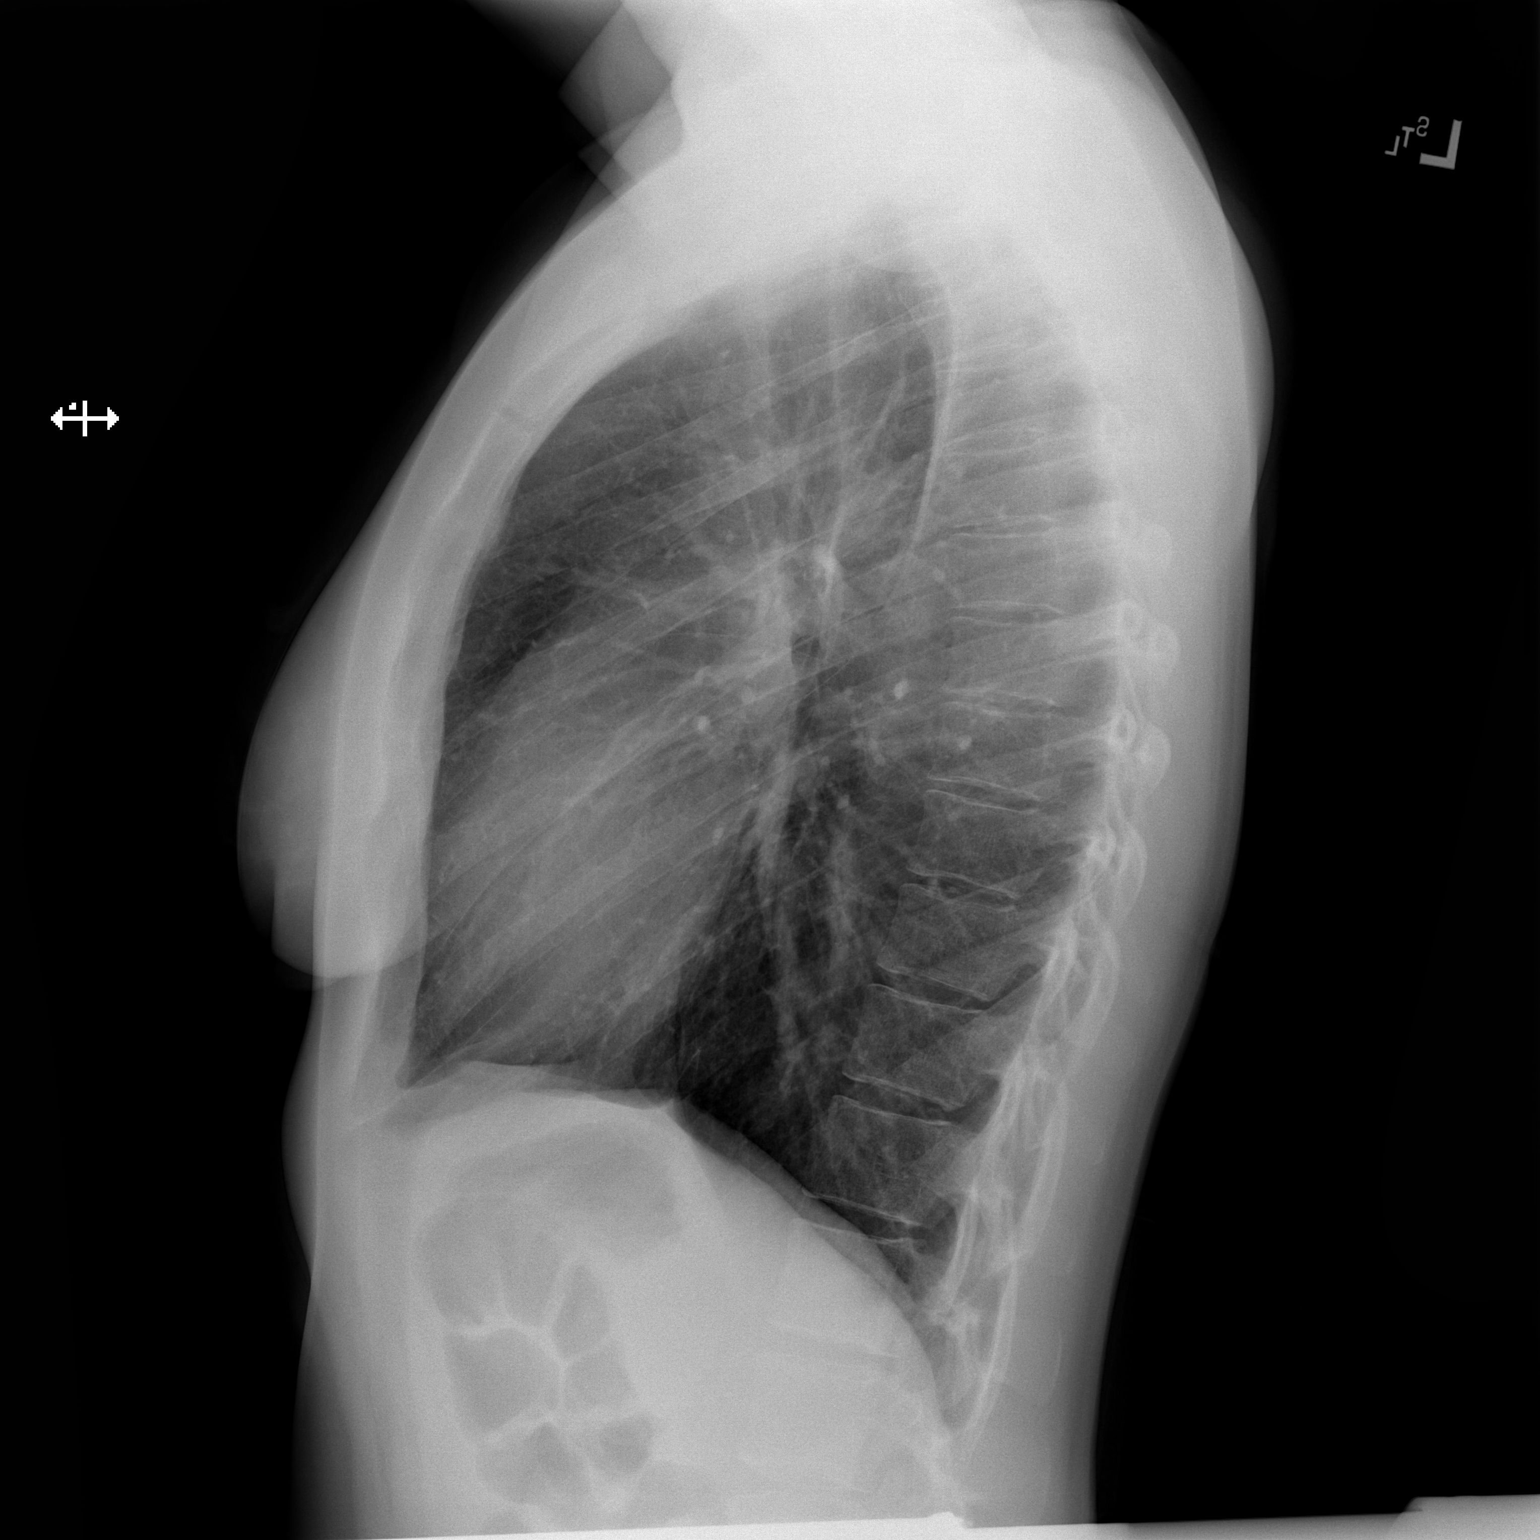

[2 of 2 positions shown; findings below may reference images not displayed]

FINDINGS: The heart size and mediastinal contours are within normal limits.
Both lungs are clear. The visualized skeletal structures are
unremarkable.
IMPRESSION: No active cardiopulmonary disease.
